# Patient Record
Sex: Male | Born: 1975 | Race: White | Hispanic: No | Marital: Married
Health system: Southern US, Community
[De-identification: ages and names within clinical notes are randomized; demographics above are authoritative.]

---

## 2017-10-14 ENCOUNTER — Other Ambulatory Visit: Payer: Self-pay | Admitting: Neurosurgery

## 2017-10-14 DIAGNOSIS — G950 Syringomyelia and syringobulbia: Secondary | ICD-10-CM

## 2017-10-28 ENCOUNTER — Ambulatory Visit
Admission: RE | Admit: 2017-10-28 | Discharge: 2017-10-28 | Disposition: A | Discharge status: Home / Self Care | Payer: Worker's Compensation | Source: Ambulatory Visit | Attending: Neurosurgery | Admitting: Neurosurgery

## 2017-10-28 ENCOUNTER — Other Ambulatory Visit: Payer: Self-pay | Admitting: Neurosurgery

## 2017-10-28 ENCOUNTER — Ambulatory Visit
Admission: RE | Admit: 2017-10-28 | Discharge: 2017-10-28 | Disposition: A | Discharge status: Home / Self Care | Payer: Self-pay | Source: Ambulatory Visit | Attending: Neurosurgery | Admitting: Neurosurgery

## 2017-10-28 DIAGNOSIS — R52 Pain, unspecified: Secondary | ICD-10-CM

## 2017-10-28 DIAGNOSIS — G950 Syringomyelia and syringobulbia: Secondary | ICD-10-CM

## 2017-10-28 MED ORDER — MEPERIDINE HCL 100 MG/ML IJ SOLN
100.0000 mg | Freq: Once | INTRAMUSCULAR | Status: AC
  Administered 2017-10-28: 100 mg via INTRAMUSCULAR

## 2017-10-28 MED ORDER — DIAZEPAM 5 MG PO TABS
10.0000 mg | ORAL_TABLET | Freq: Once | ORAL | Status: AC
  Administered 2017-10-28: 15 mg via ORAL

## 2017-10-28 MED ORDER — HYDROXYZINE HCL 50 MG/ML IM SOLN
25.0000 mg | Freq: Once | INTRAMUSCULAR | Status: AC
  Administered 2017-10-28: 25 mg via INTRAMUSCULAR

## 2017-10-28 MED ORDER — IOPAMIDOL (ISOVUE-M 300) INJECTION 61%
10.0000 mL | Freq: Once | INTRAMUSCULAR | Status: AC | PRN
  Administered 2017-10-28: 10 mL via INTRATHECAL

## 2017-10-28 NOTE — Discharge Instructions (Signed)
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
